# Patient Record
Sex: Male | Born: 1935 | Race: White | Hispanic: No | Marital: Married | Smoking: Former smoker
Health system: Southern US, Community
[De-identification: ages and names within clinical notes are randomized; demographics above are authoritative.]

## PROBLEM LIST (undated history)

## (undated) DIAGNOSIS — C61 Malignant neoplasm of prostate: Secondary | ICD-10-CM

## (undated) DIAGNOSIS — N189 Chronic kidney disease, unspecified: Secondary | ICD-10-CM

## (undated) DIAGNOSIS — J189 Pneumonia, unspecified organism: Secondary | ICD-10-CM

## (undated) DIAGNOSIS — N400 Enlarged prostate without lower urinary tract symptoms: Secondary | ICD-10-CM

## (undated) DIAGNOSIS — I10 Essential (primary) hypertension: Secondary | ICD-10-CM

## (undated) HISTORY — DX: Malignant neoplasm of prostate: C61

## (undated) HISTORY — DX: Benign prostatic hyperplasia without lower urinary tract symptoms: N40.0

## (undated) HISTORY — DX: Essential (primary) hypertension: I10

## (undated) HISTORY — DX: Chronic kidney disease, unspecified: N18.9

---

## 2011-09-11 DIAGNOSIS — C61 Malignant neoplasm of prostate: Secondary | ICD-10-CM

## 2011-09-11 HISTORY — DX: Malignant neoplasm of prostate: C61

## 2011-09-11 HISTORY — PX: OTHER SURGICAL HISTORY: SHX169

## 2011-09-27 ENCOUNTER — Other Ambulatory Visit: Payer: Self-pay | Admitting: Urology

## 2011-09-27 DIAGNOSIS — C61 Malignant neoplasm of prostate: Secondary | ICD-10-CM

## 2011-09-30 ENCOUNTER — Encounter (HOSPITAL_COMMUNITY)
Admission: RE | Admit: 2011-09-30 | Discharge: 2011-09-30 | Disposition: A | Discharge status: Home / Self Care | Payer: Medicare Other | Source: Ambulatory Visit | Attending: Urology | Admitting: Urology

## 2011-09-30 DIAGNOSIS — C61 Malignant neoplasm of prostate: Secondary | ICD-10-CM

## 2011-09-30 MED ORDER — TECHNETIUM TC 99M MEDRONATE IV KIT
26.0000 | PACK | Freq: Once | INTRAVENOUS | Status: AC | PRN
  Administered 2011-09-30: 26 via INTRAVENOUS

## 2011-10-09 ENCOUNTER — Ambulatory Visit: Payer: No Typology Code available for payment source

## 2011-10-09 ENCOUNTER — Encounter: Payer: Self-pay | Admitting: *Deleted

## 2011-10-09 ENCOUNTER — Encounter: Payer: Self-pay | Admitting: Radiation Oncology

## 2011-10-09 ENCOUNTER — Ambulatory Visit: Payer: No Typology Code available for payment source | Admitting: Radiation Oncology

## 2011-10-09 ENCOUNTER — Ambulatory Visit
Admission: RE | Admit: 2011-10-09 | Discharge: 2011-10-09 | Disposition: A | Discharge status: Home / Self Care | Payer: Medicare Other | Source: Ambulatory Visit | Attending: Radiation Oncology | Admitting: Radiation Oncology

## 2011-10-09 VITALS — BP 157/78 | HR 76 | Temp 97.1°F | Resp 20 | Wt 210.9 lb

## 2011-10-09 DIAGNOSIS — C61 Malignant neoplasm of prostate: Secondary | ICD-10-CM | POA: Insufficient documentation

## 2011-10-09 DIAGNOSIS — I10 Essential (primary) hypertension: Secondary | ICD-10-CM | POA: Insufficient documentation

## 2011-10-09 DIAGNOSIS — N189 Chronic kidney disease, unspecified: Secondary | ICD-10-CM | POA: Insufficient documentation

## 2011-10-09 NOTE — Progress Notes (Signed)
Path:09/11/2011: Prostate UJW:[id number],YQMVHQI= 4+4=8, PSA=4.75,Volume=41cc  Married,Retired, 2 children, 1 brother had prostate cancer but deceased from stomach cancer,  No c/o dysuria, no problems stated pt    Allergies:NKDA

## 2011-10-09 NOTE — Progress Notes (Signed)
CC:   Excell Seltzer. Annabell Howells, M.D. Shanon Payor, MD Shelle Iron, MD  REFERRING PHYSICIAN:  Excell Seltzer. Annabell Howells, M.D.  DIAGNOSIS:  Stage T1c Gleason 8 prostate cancer.  NARRATIVE:  Mr. Krupinski is a very pleasant, 78 year old gentleman who is seen out of the courtesy of Dr. Annabell Howells for consideration for radiation therapy as part of the management of the patient's recently diagnosed prostate cancer.  Mr. Celani was found to have a mildly elevated PSA. However, is percent-free PSA was abnormal.  He subsequently proceeded to undergo biopsy with Gleason 8 prostate cancer recovered in 2/12 biopsies.  The patient does live in the [place] area and would like to pursue treatment at the [facility].  In light of this, the patient will be set up to see Dr. Clovis Riley in the near future for a consultation.  Again, the patient's Gleason score is 8 and will likely proceed with androgen ablation as part of his overall therapy.  I will defer to Dr. Clovis Riley as to whether the patient will have fiducial markers placed as part of the patient's image-guided IMRT.    ______________________________ Billie Lade, Ph.D., M.D. JDK/MEDQ  D:  10/09/2011  T:  10/09/2011  Job:  [id number]

## 2011-10-09 NOTE — Progress Notes (Signed)
Please see the Nurse Progress Note in the MD Initial Consult Encounter for this patient. 

## 2015-08-03 NOTE — H&P (Signed)
TOTAL HIP ADMISSION H&P  Patient is admitted for right total hip arthroplasty, anterior approach.  Subjective:  Chief Complaint:   Right hip primary OA / pain  HPI: Troy Mcdowell, 81 y.o. male, has a history of pain and functional disability in the right hip(s) due to arthritis and patient has failed non-surgical conservative treatments for greater than 12 weeks to include NSAID's and/or analgesics and activity modification.  Onset of symptoms was gradual starting 2.5+ years ago with gradually worsening course since that time.The patient noted no past surgery on the right hip(s).  Patient currently rates pain in the right hip at 1 out of 10 with activity, more complaints significant decreased ROM. Patient has worsening of pain with activity and weight bearing, trendelenberg gait, pain that interfers with activities of daily living and pain with passive range of motion. Patient has evidence of periarticular osteophytes and joint space narrowing by imaging studies. This condition presents safety issues increasing the risk of falls.  There is no current active infection.  Risks, benefits and expectations were discussed with the patient.  Risks including but not limited to the risk of anesthesia, blood clots, nerve damage, blood vessel damage, failure of the prosthesis, infection and up to and including death.  Patient understand the risks, benefits and expectations and wishes to proceed with surgery.   PCP: No primary care provider on file.  D/C Plans:      Home with HHPT  Post-op Meds:       No Rx given   Tranexamic Acid:      To be given - IV   Decadron:      Is to be given  FYI:     ASA  Norco    Patient Active Problem List   Diagnosis Date Noted  . Chronic kidney disease   . Hypertension   . Prostate cancer ([place]) 09/11/2011   Past Medical History  Diagnosis Date  . Prostate cancer 09/11/11    prosate ca,gleason $=4=8,PSa=4.75,volume=41cc  . Chronic kidney disease     nephrolithiasis   . Hypertension   . BPH (benign prostatic hyperplasia)   . Cataract     forming  no surgery as yet,obs    Past Surgical History  Procedure Laterality Date  . Prostate biopsy 09/11/2011  09/11/2011    gleason 4+4=8 PSA 4.75    No prescriptions prior to admission   No Known Allergies   Social History  Substance Use Topics  . Smoking status: Former Smoker -- 1.00 packs/day for 15 years    Quit date: 10/09/1979  . Smokeless tobacco: Not on file  . Alcohol Use: Yes     Comment: social    Family History  Problem Relation Age of Onset  . Pneumonia Mother     deceased age 87 with pneumonia  . Heart disease Father     deceased age 9  . Prostate cancer Brother   . Stomach cancer Brother     had prostate ca but deceased from stomach ca     Review of Systems  Constitutional: Negative.   HENT: Positive for hearing loss.   Eyes: Negative.   Respiratory: Negative.   Cardiovascular: Negative.   Gastrointestinal: Negative.   Genitourinary: Negative.   Musculoskeletal: Positive for joint pain.  Skin: Negative.   Neurological: Negative.   Endo/Heme/Allergies: Negative.   Psychiatric/Behavioral: Negative.     Objective:  Physical Exam  Constitutional: He is oriented to person, place, and time. He appears well-developed.  HENT:  Head:  Normocephalic.  Eyes: Pupils are equal, round, and reactive to light.  Neck: Neck supple. No JVD present. No tracheal deviation present. No thyromegaly present.  Cardiovascular: Normal rate, regular rhythm, normal heart sounds and intact distal pulses.   Respiratory: Effort normal and breath sounds normal. No stridor. No respiratory distress. He has no wheezes.  GI: Soft. There is no tenderness. There is no guarding.  Musculoskeletal:       Right hip: He exhibits decreased range of motion, decreased strength, tenderness and bony tenderness. He exhibits no swelling, no deformity and no laceration.  Lymphadenopathy:    He has no cervical adenopathy.   Neurological: He is alert and oriented to person, place, and time.  Skin: Skin is warm and dry.  Psychiatric: He has a normal mood and affect.      Imaging Review Plain radiographs demonstrate severe degenerative joint disease of the right hip(s). The bone quality appears to be good for age and reported activity level.  Assessment/Plan:  End stage arthritis, right hip(s)  The patient history, physical examination, clinical judgement of the provider and imaging studies are consistent with end stage degenerative joint disease of the right hip(s) and total hip arthroplasty is deemed medically necessary. The treatment options including medical management, injection therapy, arthroscopy and arthroplasty were discussed at length. The risks and benefits of total hip arthroplasty were presented and reviewed. The risks due to aseptic loosening, infection, stiffness, dislocation/subluxation,  thromboembolic complications and other imponderables were discussed.  The patient acknowledged the explanation, agreed to proceed with the plan and consent was signed. Patient is being admitted for inpatient treatment for surgery, pain control, PT, OT, prophylactic antibiotics, VTE prophylaxis, progressive ambulation and ADL's and discharge planning.The patient is planning to be discharged home with home health services.      [place] Signa Cheek   PA-C  08/03/2015, 10:17 AM

## 2015-08-07 ENCOUNTER — Other Ambulatory Visit (HOSPITAL_COMMUNITY): Payer: Self-pay | Admitting: *Deleted

## 2015-08-07 NOTE — Patient Instructions (Addendum)
Troy Mcdowell  08/07/2015   Your procedure is scheduled on: 08-15-15  Report to [facility]  Entrance take [facility]  elevators to 3rd floor to  [place] at 8:30 AM.  Call this number if you have problems the morning of surgery [phone]   Remember: ONLY 1 PERSON MAY GO WITH YOU TO SHORT STAY TO GET  READY MORNING OF Troy Mcdowell.  Do not eat food or drink liquids :After Midnight.     Take these medicines the morning of surgery with A SIP OF WATER: Amlodipine (Norvasc) DO NOT TAKE ANY DIABETIC MEDICATIONS DAY OF YOUR SURGERY                               You may not have any metal on your body including hair pins and              piercings  Do not wear jewelry, , lotions, powders or perfumes, deodorant                      Men may shave face and neck.   Do not bring valuables to the hospital. Troy Mcdowell.  Contacts, dentures or bridgework may not be worn into surgery.  Leave suitcase in the car. After surgery it may be brought to your room.        Special Instructions: coughing and deep breathing exercises, leg exercises              Please read over the following fact sheets you were given: _____________________________________________________________________             [facility] - Preparing for Surgery Before surgery, you can play an important role.  Because skin is not sterile, your skin needs to be as free of germs as possible.  You can reduce the number of germs on your skin by washing with CHG (chlorahexidine gluconate) soap before surgery.  CHG is an antiseptic cleaner which kills germs and bonds with the skin to continue killing germs even after washing. Please DO NOT use if you have an allergy to CHG or antibacterial soaps.  If your skin becomes reddened/irritated stop using the CHG and inform your nurse when you arrive at Short Stay. Do not shave (including legs and underarms) for at  least 48 hours prior to the first CHG shower.  You may shave your face/neck. Please follow these instructions carefully:  1.  Shower with CHG Soap the night before surgery and the  morning of Surgery.  2.  If you choose to wash your hair, wash your hair first as usual with your  normal  shampoo.  3.  After you shampoo, rinse your hair and body thoroughly to remove the  shampoo.                           4.  Use CHG as you would any other liquid soap.  You can apply chg directly  to the skin and wash                       Gently with a scrungie or clean washcloth.  5.  Apply the  CHG Soap to your body ONLY FROM THE NECK DOWN.   Do not use on face/ open                           Wound or open sores. Avoid contact with eyes, ears mouth and genitals (private parts).                       Wash face,  Genitals (private parts) with your normal soap.             6.  Wash thoroughly, paying special attention to the area where your surgery  will be performed.  7.  Thoroughly rinse your body with warm water from the neck down.  8.  DO NOT shower/wash with your normal soap after using and rinsing off  the CHG Soap.                9.  Pat yourself dry with a clean towel.            10.  Wear clean pajamas.            11.  Place clean sheets on your bed the night of your first shower and do not  sleep with pets. Day of Surgery : Do not apply any lotions/deodorants the morning of surgery.  Please wear clean clothes to the hospital/surgery center.  FAILURE TO FOLLOW THESE INSTRUCTIONS MAY RESULT IN THE CANCELLATION OF YOUR SURGERY PATIENT SIGNATURE_________________________________  NURSE SIGNATURE__________________________________  ________________________________________________________________________   Troy Mcdowell  An incentive spirometer is a tool that can help keep your lungs clear and active. This tool measures how well you are filling your lungs with each breath. Taking long deep breaths  may help reverse or decrease the chance of developing breathing (pulmonary) problems (especially infection) following:  A long period of time when you are unable to move or be active. BEFORE THE PROCEDURE   If the spirometer includes an indicator to show your best effort, your nurse or respiratory therapist will set it to a desired goal.  If possible, sit up straight or lean slightly forward. Try not to slouch.  Hold the incentive spirometer in an upright position. INSTRUCTIONS FOR USE   Sit on the edge of your bed if possible, or sit up as far as you can in bed or on a chair.  Hold the incentive spirometer in an upright position.  Breathe out normally.  Place the mouthpiece in your mouth and seal your lips tightly around it.  Breathe in slowly and as deeply as possible, raising the piston or the ball toward the top of the column.  Hold your breath for 3-5 seconds or for as long as possible. Allow the piston or ball to fall to the bottom of the column.  Remove the mouthpiece from your mouth and breathe out normally.  Rest for a few seconds and repeat Steps 1 through 7 at least 10 times every 1-2 hours when you are awake. Take your time and take a few normal breaths between deep breaths.  The spirometer may include an indicator to show your best effort. Use the indicator as a goal to work toward during each repetition.  After each set of 10 deep breaths, practice coughing to be sure your lungs are clear. If you have an incision (the cut made at the time of surgery), support your incision when coughing by placing a pillow or rolled up  towels firmly against it. Once you are able to get out of bed, walk around indoors and cough well. You may stop using the incentive spirometer when instructed by your caregiver.  RISKS AND COMPLICATIONS  Take your time so you do not get dizzy or light-headed.  If you are in pain, you may need to take or ask for pain medication before doing incentive  spirometry. It is harder to take a deep breath if you are having pain. AFTER USE  Rest and breathe slowly and easily.  It can be helpful to keep track of a log of your progress. Your caregiver can provide you with a simple table to help with this. If you are using the spirometer at home, follow these instructions: [place] IF:   You are having difficultly using the spirometer.  You have trouble using the spirometer as often as instructed.  Your pain medication is not giving enough relief while using the spirometer.  You develop fever of 100.5 F (38.1 C) or higher. SEEK IMMEDIATE MEDICAL CARE IF:   You cough up bloody sputum that had not been present before.  You develop fever of 102 F (38.9 C) or greater.  You develop worsening pain at or near the incision site. MAKE SURE YOU:   Understand these instructions.  Will watch your condition.  Will get help right away if you are not doing well or get worse. Document Released: 12/30/2006 Document Revised: 11/11/2011 Document Reviewed: 03/02/2007 ExitCare Patient Information 2014 ExitCare, [place].   ________________________________________________________________________  WHAT IS A BLOOD TRANSFUSION? Blood Transfusion Information  A transfusion is the replacement of blood or some of its parts. Blood is made up of multiple cells which provide different functions.  Red blood cells carry oxygen and are used for blood loss replacement.  White blood cells fight against infection.  Platelets control bleeding.  Plasma helps clot blood.  Other blood products are available for specialized needs, such as hemophilia or other clotting disorders. BEFORE THE TRANSFUSION  Who gives blood for transfusions?   Healthy volunteers who are fully evaluated to make sure their blood is safe. This is blood bank blood. Transfusion therapy is the safest it has ever been in the practice of medicine. Before blood is taken from a donor, a  complete history is taken to make sure that person has no history of diseases nor engages in risky social behavior (examples are intravenous drug use or sexual activity with multiple partners). The donor's travel history is screened to minimize risk of transmitting infections, such as malaria. The donated blood is tested for signs of infectious diseases, such as HIV and hepatitis. The blood is then tested to be sure it is compatible with you in order to minimize the chance of a transfusion reaction. If you or a relative donates blood, this is often done in anticipation of surgery and is not appropriate for emergency situations. It takes many days to process the donated blood. RISKS AND COMPLICATIONS Although transfusion therapy is very safe and saves many lives, the main dangers of transfusion include:   Getting an infectious disease.  Developing a transfusion reaction. This is an allergic reaction to something in the blood you were given. Every precaution is taken to prevent this. The decision to have a blood transfusion has been considered carefully by your caregiver before blood is given. Blood is not given unless the benefits outweigh the risks. AFTER THE TRANSFUSION  Right after receiving a blood transfusion, you will usually feel much  better and more energetic. This is especially true if your red blood cells have gotten low (anemic). The transfusion raises the level of the red blood cells which carry oxygen, and this usually causes an energy increase.  The nurse administering the transfusion will monitor you carefully for complications. HOME CARE INSTRUCTIONS  No special instructions are needed after a transfusion. You may find your energy is better. Speak with your caregiver about any limitations on activity for underlying diseases you may have. SEEK MEDICAL CARE IF:   Your condition is not improving after your transfusion.  You develop redness or irritation at the intravenous (IV)  site. SEEK IMMEDIATE MEDICAL CARE IF:  Any of the following symptoms occur over the next 12 hours:  Shaking chills.  You have a temperature by mouth above 102 F (38.9 C), not controlled by medicine.  Chest, back, or muscle pain.  People around you feel you are not acting correctly or are confused.  Shortness of breath or difficulty breathing.  Dizziness and fainting.  You get a rash or develop hives.  You have a decrease in urine output.  Your urine turns a dark color or changes to pink, red, or brown. Any of the following symptoms occur over the next 10 days:  You have a temperature by mouth above 102 F (38.9 C), not controlled by medicine.  Shortness of breath.  Weakness after normal activity.  The white part of the eye turns yellow (jaundice).  You have a decrease in the amount of urine or are urinating less often.  Your urine turns a dark color or changes to pink, red, or brown. Document Released: 08/16/2000 Document Revised: 11/11/2011 Document Reviewed: 04/04/2008 [facility] Patient Information 2014 [place], [place].  _______________________________________________________________________

## 2015-08-08 ENCOUNTER — Encounter (HOSPITAL_COMMUNITY)
Admission: RE | Admit: 2015-08-08 | Discharge: 2015-08-08 | Disposition: A | Discharge status: Home / Self Care | Payer: Medicare Other | Source: Ambulatory Visit | Attending: Orthopedic Surgery | Admitting: Orthopedic Surgery

## 2015-08-08 ENCOUNTER — Encounter (HOSPITAL_COMMUNITY): Payer: Self-pay

## 2015-08-08 DIAGNOSIS — Z01818 Encounter for other preprocedural examination: Secondary | ICD-10-CM | POA: Insufficient documentation

## 2015-08-08 DIAGNOSIS — M1611 Unilateral primary osteoarthritis, right hip: Secondary | ICD-10-CM | POA: Insufficient documentation

## 2015-08-08 HISTORY — DX: Pneumonia, unspecified organism: J18.9

## 2015-08-08 LAB — ABO/RH: ABO/RH(D): A POS

## 2015-08-08 LAB — URINALYSIS, ROUTINE W REFLEX MICROSCOPIC
Bilirubin Urine: NEGATIVE
GLUCOSE, UA: NEGATIVE mg/dL
Hgb urine dipstick: NEGATIVE
KETONES UR: NEGATIVE mg/dL
LEUKOCYTES UA: NEGATIVE
Nitrite: NEGATIVE
PH: 6.5 (ref 5.0–8.0)
Protein, ur: NEGATIVE mg/dL
Specific Gravity, Urine: 1.013 (ref 1.005–1.030)

## 2015-08-08 LAB — BASIC METABOLIC PANEL
ANION GAP: 8 (ref 5–15)
BUN: 16 mg/dL (ref 6–20)
CALCIUM: 9.7 mg/dL (ref 8.9–10.3)
CO2: 23 mmol/L (ref 22–32)
Chloride: 106 mmol/L (ref 101–111)
Creatinine, Ser: 0.83 mg/dL (ref 0.61–1.24)
GLUCOSE: 87 mg/dL (ref 65–99)
Potassium: 4.2 mmol/L (ref 3.5–5.1)
SODIUM: 137 mmol/L (ref 135–145)

## 2015-08-08 LAB — CBC
HCT: 47.9 % (ref 39.0–52.0)
HEMOGLOBIN: 16.5 g/dL (ref 13.0–17.0)
MCH: 28.8 pg (ref 26.0–34.0)
MCHC: 34.4 g/dL (ref 30.0–36.0)
MCV: 83.7 fL (ref 78.0–100.0)
Platelets: 183 10*3/uL (ref 150–400)
RBC: 5.72 MIL/uL (ref 4.22–5.81)
RDW: 13.3 % (ref 11.5–15.5)
WBC: 7.5 10*3/uL (ref 4.0–10.5)

## 2015-08-08 LAB — SURGICAL PCR SCREEN
MRSA, PCR: NEGATIVE
STAPHYLOCOCCUS AUREUS: NEGATIVE

## 2015-08-08 LAB — PROTIME-INR
INR: 1.02 (ref 0.00–1.49)
PROTHROMBIN TIME: 13.6 s (ref 11.6–15.2)

## 2015-08-08 LAB — APTT: APTT: 31 s (ref 24–37)

## 2015-08-08 NOTE — Progress Notes (Signed)
08-08-15 - Left message for [name] at [facility] regarding Mr. Troy Mcdowell - PAT visit on 08-08-15 - preliminary EKG results were Normal sinus rhythm - Normal EKG.

## 2015-08-08 NOTE — Progress Notes (Signed)
08-08-15 - Faxed preop lab results and EKG to [name] at [place] at [phone] as requested.

## 2015-08-15 ENCOUNTER — Inpatient Hospital Stay (HOSPITAL_COMMUNITY): Payer: Medicare Other | Admitting: Anesthesiology

## 2015-08-15 ENCOUNTER — Encounter (HOSPITAL_COMMUNITY)
Admission: RE | Disposition: A | Discharge status: Home Health | Payer: Self-pay | Source: Ambulatory Visit | Attending: Orthopedic Surgery

## 2015-08-15 ENCOUNTER — Encounter (HOSPITAL_COMMUNITY): Payer: Self-pay | Admitting: *Deleted

## 2015-08-15 ENCOUNTER — Inpatient Hospital Stay (HOSPITAL_COMMUNITY): Payer: Medicare Other

## 2015-08-15 ENCOUNTER — Inpatient Hospital Stay (HOSPITAL_COMMUNITY)
Admission: RE | Admit: 2015-08-15 | Discharge: 2015-08-16 | DRG: 470 | Disposition: A | Discharge status: Home Health | Payer: Medicare Other | Source: Ambulatory Visit | Attending: Orthopedic Surgery | Admitting: Orthopedic Surgery

## 2015-08-15 DIAGNOSIS — Z8546 Personal history of malignant neoplasm of prostate: Secondary | ICD-10-CM | POA: Diagnosis not present

## 2015-08-15 DIAGNOSIS — Z87891 Personal history of nicotine dependence: Secondary | ICD-10-CM

## 2015-08-15 DIAGNOSIS — M25551 Pain in right hip: Secondary | ICD-10-CM | POA: Diagnosis present

## 2015-08-15 DIAGNOSIS — I129 Hypertensive chronic kidney disease with stage 1 through stage 4 chronic kidney disease, or unspecified chronic kidney disease: Secondary | ICD-10-CM | POA: Diagnosis not present

## 2015-08-15 DIAGNOSIS — N189 Chronic kidney disease, unspecified: Secondary | ICD-10-CM | POA: Diagnosis present

## 2015-08-15 DIAGNOSIS — M1611 Unilateral primary osteoarthritis, right hip: Principal | ICD-10-CM | POA: Diagnosis present

## 2015-08-15 DIAGNOSIS — Z01812 Encounter for preprocedural laboratory examination: Secondary | ICD-10-CM | POA: Diagnosis not present

## 2015-08-15 DIAGNOSIS — E669 Obesity, unspecified: Secondary | ICD-10-CM | POA: Diagnosis present

## 2015-08-15 DIAGNOSIS — Z96649 Presence of unspecified artificial hip joint: Secondary | ICD-10-CM

## 2015-08-15 HISTORY — PX: TOTAL HIP ARTHROPLASTY: SHX124

## 2015-08-15 LAB — TYPE AND SCREEN
ABO/RH(D): A POS
Antibody Screen: NEGATIVE

## 2015-08-15 SURGERY — ARTHROPLASTY, HIP, TOTAL, ANTERIOR APPROACH
Anesthesia: Spinal | Site: Hip | Laterality: Right

## 2015-08-15 MED ORDER — SODIUM CHLORIDE 0.9 % IV SOLN
1000.0000 mg | Freq: Once | INTRAVENOUS | Status: AC
  Administered 2015-08-15: 1000 mg via INTRAVENOUS
  Filled 2015-08-15: qty 10

## 2015-08-15 MED ORDER — METOCLOPRAMIDE HCL 10 MG PO TABS
5.0000 mg | ORAL_TABLET | Freq: Three times a day (TID) | ORAL | Status: DC | PRN
  Administered 2015-08-16: 10 mg via ORAL
  Filled 2015-08-15: qty 1

## 2015-08-15 MED ORDER — DEXAMETHASONE SODIUM PHOSPHATE 10 MG/ML IJ SOLN
INTRAMUSCULAR | Status: DC | PRN
  Administered 2015-08-15: 10 mg via INTRAVENOUS

## 2015-08-15 MED ORDER — PROPOFOL 10 MG/ML IV BOLUS
INTRAVENOUS | Status: AC
  Filled 2015-08-15: qty 20

## 2015-08-15 MED ORDER — AMLODIPINE BESYLATE 10 MG PO TABS
10.0000 mg | ORAL_TABLET | Freq: Every day | ORAL | Status: DC
  Administered 2015-08-15 – 2015-08-16 (×2): 10 mg via ORAL
  Filled 2015-08-15 (×2): qty 1

## 2015-08-15 MED ORDER — DEXAMETHASONE SODIUM PHOSPHATE 10 MG/ML IJ SOLN
INTRAMUSCULAR | Status: AC
  Filled 2015-08-15: qty 1

## 2015-08-15 MED ORDER — CELECOXIB 200 MG PO CAPS
200.0000 mg | ORAL_CAPSULE | Freq: Two times a day (BID) | ORAL | Status: DC
  Administered 2015-08-15 – 2015-08-16 (×2): 200 mg via ORAL
  Filled 2015-08-15 (×3): qty 1

## 2015-08-15 MED ORDER — LACTATED RINGERS IV SOLN
INTRAVENOUS | Status: DC
  Administered 2015-08-15: 1000 mL via INTRAVENOUS
  Administered 2015-08-15: 13:00:00 via INTRAVENOUS

## 2015-08-15 MED ORDER — HYDROMORPHONE HCL 1 MG/ML IJ SOLN
0.2500 mg | INTRAMUSCULAR | Status: DC | PRN
  Administered 2015-08-15 (×3): 0.5 mg via INTRAVENOUS

## 2015-08-15 MED ORDER — POLYETHYLENE GLYCOL 3350 17 G PO PACK
17.0000 g | PACK | Freq: Two times a day (BID) | ORAL | Status: DC
  Administered 2015-08-15 – 2015-08-16 (×3): 17 g via ORAL

## 2015-08-15 MED ORDER — PROPOFOL 10 MG/ML IV BOLUS
INTRAVENOUS | Status: DC | PRN
  Administered 2015-08-15: 10 mg via INTRAVENOUS
  Administered 2015-08-15: 20 mg via INTRAVENOUS
  Administered 2015-08-15: 10 mg via INTRAVENOUS
  Administered 2015-08-15: 20 mg via INTRAVENOUS
  Administered 2015-08-15: 50 mg via INTRAVENOUS
  Administered 2015-08-15: 20 mg via INTRAVENOUS
  Administered 2015-08-15: 40 mg via INTRAVENOUS
  Administered 2015-08-15: 20 mg via INTRAVENOUS
  Administered 2015-08-15: 30 mg via INTRAVENOUS
  Administered 2015-08-15: 10 mg via INTRAVENOUS
  Administered 2015-08-15: 20 mg via INTRAVENOUS
  Administered 2015-08-15: 30 mg via INTRAVENOUS
  Administered 2015-08-15: 20 mg via INTRAVENOUS
  Administered 2015-08-15 (×3): 30 mg via INTRAVENOUS
  Administered 2015-08-15 (×4): 20 mg via INTRAVENOUS
  Administered 2015-08-15: 10 mg via INTRAVENOUS
  Administered 2015-08-15: 40 mg via INTRAVENOUS
  Administered 2015-08-15: 20 mg via INTRAVENOUS
  Administered 2015-08-15: 10 mg via INTRAVENOUS
  Administered 2015-08-15: 20 mg via INTRAVENOUS
  Administered 2015-08-15: 30 mg via INTRAVENOUS
  Administered 2015-08-15: 10 mg via INTRAVENOUS
  Administered 2015-08-15 (×5): 20 mg via INTRAVENOUS
  Administered 2015-08-15: 30 mg via INTRAVENOUS
  Administered 2015-08-15: 10 mg via INTRAVENOUS
  Administered 2015-08-15 (×2): 20 mg via INTRAVENOUS
  Administered 2015-08-15: 10 mg via INTRAVENOUS

## 2015-08-15 MED ORDER — DIPHENHYDRAMINE HCL 25 MG PO CAPS: 25.0000 mg | ORAL_CAPSULE | Freq: Four times a day (QID) | ORAL | Status: DC | PRN

## 2015-08-15 MED ORDER — PHENYLEPHRINE HCL 10 MG/ML IJ SOLN
10.0000 mg | INTRAVENOUS | Status: DC | PRN
  Administered 2015-08-15: 50 ug/min via INTRAVENOUS

## 2015-08-15 MED ORDER — HYDROMORPHONE HCL 1 MG/ML IJ SOLN
INTRAMUSCULAR | Status: AC
  Filled 2015-08-15: qty 1

## 2015-08-15 MED ORDER — MAGNESIUM CITRATE PO SOLN: 1.0000 | Freq: Once | ORAL | Status: DC | PRN

## 2015-08-15 MED ORDER — ASPIRIN EC 325 MG PO TBEC
325.0000 mg | DELAYED_RELEASE_TABLET | Freq: Two times a day (BID) | ORAL | Status: DC
  Administered 2015-08-16: 325 mg via ORAL
  Filled 2015-08-15 (×3): qty 1

## 2015-08-15 MED ORDER — METHOCARBAMOL 500 MG PO TABS: 500.0000 mg | ORAL_TABLET | Freq: Four times a day (QID) | ORAL | Status: DC | PRN

## 2015-08-15 MED ORDER — PHENYLEPHRINE HCL 10 MG/ML IJ SOLN
INTRAMUSCULAR | Status: AC
  Filled 2015-08-15: qty 1

## 2015-08-15 MED ORDER — MENTHOL 3 MG MT LOZG: 1.0000 | LOZENGE | OROMUCOSAL | Status: DC | PRN

## 2015-08-15 MED ORDER — PHENOL 1.4 % MT LIQD: 1.0000 | OROMUCOSAL | Status: DC | PRN

## 2015-08-15 MED ORDER — PHENYLEPHRINE HCL 10 MG/ML IJ SOLN
INTRAMUSCULAR | Status: DC | PRN
  Administered 2015-08-15 (×2): 40 ug via INTRAVENOUS
  Administered 2015-08-15: 120 ug via INTRAVENOUS
  Administered 2015-08-15: 40 ug via INTRAVENOUS

## 2015-08-15 MED ORDER — DOCUSATE SODIUM 100 MG PO CAPS
100.0000 mg | ORAL_CAPSULE | Freq: Two times a day (BID) | ORAL | Status: DC
  Administered 2015-08-15 – 2015-08-16 (×3): 100 mg via ORAL

## 2015-08-15 MED ORDER — LIDOCAINE HCL (CARDIAC) 20 MG/ML IV SOLN
INTRAVENOUS | Status: DC | PRN
  Administered 2015-08-15: 50 mg via INTRAVENOUS

## 2015-08-15 MED ORDER — LIDOCAINE HCL (CARDIAC) 20 MG/ML IV SOLN
INTRAVENOUS | Status: AC
  Filled 2015-08-15: qty 5

## 2015-08-15 MED ORDER — METHOCARBAMOL 1000 MG/10ML IJ SOLN
500.0000 mg | Freq: Four times a day (QID) | INTRAVENOUS | Status: DC | PRN
  Administered 2015-08-15: 500 mg via INTRAVENOUS
  Filled 2015-08-15 (×2): qty 5

## 2015-08-15 MED ORDER — DEXAMETHASONE SODIUM PHOSPHATE 10 MG/ML IJ SOLN: 10.0000 mg | Freq: Once | INTRAMUSCULAR | Status: DC

## 2015-08-15 MED ORDER — BUPIVACAINE IN DEXTROSE 0.75-8.25 % IT SOLN
INTRATHECAL | Status: DC | PRN
  Administered 2015-08-15: 2 mL via INTRATHECAL

## 2015-08-15 MED ORDER — CEFAZOLIN SODIUM-DEXTROSE 2-3 GM-% IV SOLR
2.0000 g | Freq: Four times a day (QID) | INTRAVENOUS | Status: AC
  Administered 2015-08-15 – 2015-08-16 (×2): 2 g via INTRAVENOUS
  Filled 2015-08-15 (×2): qty 50

## 2015-08-15 MED ORDER — PHENYLEPHRINE 40 MCG/ML (10ML) SYRINGE FOR IV PUSH (FOR BLOOD PRESSURE SUPPORT)
PREFILLED_SYRINGE | INTRAVENOUS | Status: AC
  Filled 2015-08-15: qty 10

## 2015-08-15 MED ORDER — CEFAZOLIN SODIUM-DEXTROSE 2-3 GM-% IV SOLR
INTRAVENOUS | Status: AC
  Filled 2015-08-15: qty 50

## 2015-08-15 MED ORDER — HYDROMORPHONE HCL 1 MG/ML IJ SOLN
0.5000 mg | INTRAMUSCULAR | Status: DC | PRN
  Administered 2015-08-15: 1 mg via INTRAVENOUS
  Administered 2015-08-15: 0.5 mg via INTRAVENOUS
  Filled 2015-08-15 (×2): qty 1

## 2015-08-15 MED ORDER — CEFAZOLIN SODIUM-DEXTROSE 2-3 GM-% IV SOLR
2.0000 g | INTRAVENOUS | Status: AC
  Administered 2015-08-15: 2 g via INTRAVENOUS

## 2015-08-15 MED ORDER — DEXAMETHASONE SODIUM PHOSPHATE 10 MG/ML IJ SOLN
10.0000 mg | Freq: Once | INTRAMUSCULAR | Status: AC
  Administered 2015-08-16: 10 mg via INTRAVENOUS
  Filled 2015-08-15: qty 1

## 2015-08-15 MED ORDER — HYDROCODONE-ACETAMINOPHEN 7.5-325 MG PO TABS
1.0000 | ORAL_TABLET | ORAL | Status: DC
  Administered 2015-08-15 (×2): 2 via ORAL
  Administered 2015-08-16 (×3): 1 via ORAL
  Filled 2015-08-15 (×2): qty 1
  Filled 2015-08-15: qty 2
  Filled 2015-08-15: qty 1
  Filled 2015-08-15: qty 2

## 2015-08-15 MED ORDER — ONDANSETRON HCL 4 MG PO TABS: 4.0000 mg | ORAL_TABLET | Freq: Four times a day (QID) | ORAL | Status: DC | PRN

## 2015-08-15 MED ORDER — CHLORHEXIDINE GLUCONATE 4 % EX LIQD: 60.0000 mL | Freq: Once | CUTANEOUS | Status: DC

## 2015-08-15 MED ORDER — ONDANSETRON HCL 4 MG/2ML IJ SOLN
4.0000 mg | Freq: Four times a day (QID) | INTRAMUSCULAR | Status: DC | PRN
  Administered 2015-08-16: 4 mg via INTRAVENOUS
  Filled 2015-08-15: qty 2

## 2015-08-15 MED ORDER — BISACODYL 10 MG RE SUPP: 10.0000 mg | Freq: Every day | RECTAL | Status: DC | PRN

## 2015-08-15 MED ORDER — PROMETHAZINE HCL 25 MG/ML IJ SOLN: 6.2500 mg | INTRAMUSCULAR | Status: DC | PRN

## 2015-08-15 MED ORDER — PROPOFOL 10 MG/ML IV BOLUS
INTRAVENOUS | Status: AC
  Filled 2015-08-15: qty 40

## 2015-08-15 MED ORDER — METOCLOPRAMIDE HCL 5 MG/ML IJ SOLN: 5.0000 mg | Freq: Three times a day (TID) | INTRAMUSCULAR | Status: DC | PRN

## 2015-08-15 MED ORDER — POTASSIUM CHLORIDE 2 MEQ/ML IV SOLN
100.0000 mL/h | INTRAVENOUS | Status: DC
  Administered 2015-08-15 – 2015-08-16 (×2): 100 mL/h via INTRAVENOUS
  Filled 2015-08-15 (×5): qty 1000

## 2015-08-15 MED ORDER — FERROUS SULFATE 325 (65 FE) MG PO TABS
325.0000 mg | ORAL_TABLET | Freq: Three times a day (TID) | ORAL | Status: DC
  Administered 2015-08-15 – 2015-08-16 (×3): 325 mg via ORAL
  Filled 2015-08-15 (×5): qty 1

## 2015-08-15 MED ORDER — ALUM & MAG HYDROXIDE-SIMETH 200-200-20 MG/5ML PO SUSP: 30.0000 mL | ORAL | Status: DC | PRN

## 2015-08-15 SURGICAL SUPPLY — 32 items
BAG DECANTER FOR FLEXI CONT (MISCELLANEOUS) IMPLANT
BAG ZIPLOCK 12X15 (MISCELLANEOUS) IMPLANT
CAPT HIP TOTAL 2 ×3 IMPLANT
CLOTH BEACON ORANGE TIMEOUT ST (SAFETY) ×3 IMPLANT
COVER PERINEAL POST (MISCELLANEOUS) ×3 IMPLANT
DRAPE STERI IOBAN 125X83 (DRAPES) ×3 IMPLANT
DRAPE U-SHAPE 47X51 STRL (DRAPES) ×6 IMPLANT
DRSG AQUACEL AG ADV 3.5X10 (GAUZE/BANDAGES/DRESSINGS) ×3 IMPLANT
DURAPREP 26ML APPLICATOR (WOUND CARE) ×3 IMPLANT
ELECT REM PT RETURN 15FT ADLT (MISCELLANEOUS) IMPLANT
ELECT REM PT RETURN 9FT ADLT (ELECTROSURGICAL) ×3
ELECTRODE REM PT RTRN 9FT ADLT (ELECTROSURGICAL) ×1 IMPLANT
GLOVE BIOGEL M 7.0 STRL (GLOVE) IMPLANT
GLOVE BIOGEL PI IND STRL 7.5 (GLOVE) ×1 IMPLANT
GLOVE BIOGEL PI IND STRL 8.5 (GLOVE) ×1 IMPLANT
GLOVE BIOGEL PI INDICATOR 7.5 (GLOVE) ×2
GLOVE BIOGEL PI INDICATOR 8.5 (GLOVE) ×2
GLOVE ECLIPSE 8.0 STRL XLNG CF (GLOVE) ×6 IMPLANT
GLOVE ORTHO TXT STRL SZ7.5 (GLOVE) ×3 IMPLANT
GOWN STRL REUS W/TWL LRG LVL3 (GOWN DISPOSABLE) ×3 IMPLANT
GOWN STRL REUS W/TWL XL LVL3 (GOWN DISPOSABLE) ×3 IMPLANT
HOLDER FOLEY CATH W/STRAP (MISCELLANEOUS) ×3 IMPLANT
LIQUID BAND (GAUZE/BANDAGES/DRESSINGS) ×3 IMPLANT
PACK ANTERIOR HIP CUSTOM (KITS) ×3 IMPLANT
SAW OSC TIP CART 19.5X105X1.3 (SAW) ×3 IMPLANT
SUT MNCRL AB 4-0 PS2 18 (SUTURE) ×3 IMPLANT
SUT VIC AB 1 CT1 36 (SUTURE) ×9 IMPLANT
SUT VIC AB 2-0 CT1 27 (SUTURE) ×4
SUT VIC AB 2-0 CT1 TAPERPNT 27 (SUTURE) ×2 IMPLANT
SUT VLOC 180 0 24IN GS25 (SUTURE) ×3 IMPLANT
TRAY FOLEY W/METER SILVER 16FR (SET/KITS/TRAYS/PACK) IMPLANT
WATER STERILE IRR 1500ML POUR (IV SOLUTION) ×3 IMPLANT

## 2015-08-15 NOTE — Anesthesia Postprocedure Evaluation (Signed)
Anesthesia Post Note  Patient: Troy Mcdowell  Procedure(s) Performed: Procedure(s) (LRB): RIGHT TOTAL HIP ARTHROPLASTY ANTERIOR APPROACH (Right)  Patient location during evaluation: PACU Anesthesia Type: Spinal Level of consciousness: oriented and awake and alert Pain management: pain level controlled Vital Signs Assessment: post-procedure vital signs reviewed and stable Respiratory status: spontaneous breathing, respiratory function stable and patient connected to nasal cannula oxygen Cardiovascular status: blood pressure returned to baseline and stable Postop Assessment: no headache, no backache and spinal receding Anesthetic complications: no    Last Vitals:  Filed Vitals:   08/15/15 1716 08/15/15 1815  BP: 116/64 124/61  Pulse: 77 78  Temp: 36.4 C 36.4 C  Resp: 16 16    Last Pain:  Filed Vitals:   08/15/15 1903  PainSc: Asleep                 Tremel Setters J

## 2015-08-15 NOTE — Transfer of Care (Signed)
Immediate Anesthesia Transfer of Care Note  Patient: Troy Mcdowell  Procedure(s) Performed: Procedure(s): RIGHT TOTAL HIP ARTHROPLASTY ANTERIOR APPROACH (Right)  Patient Location: PACU  Anesthesia Type:MAC and Spinal  Level of Consciousness: Patient easily awoken, comfortable, cooperative, following commands, responds to stimulation.   Airway & Oxygen Therapy: Patient spontaneously breathing, ventilating well, oxygen via simple oxygen mask.  Post-op Assessment: Report given to PACU RN, vital signs reviewed and stable, moving all extremities.   Post vital signs: Reviewed and stable.  Complications: No apparent anesthesia complications

## 2015-08-15 NOTE — Anesthesia Procedure Notes (Signed)
Spinal Patient location during procedure: OR Staffing Anesthesiologist: Franne Grip Performed by: anesthesiologist  Preanesthetic Checklist Completed: patient identified, site marked, surgical consent, pre-op evaluation, timeout performed, IV checked, risks and benefits discussed and monitors and equipment checked Spinal Block Patient position: sitting Prep: Betadine Patient monitoring: heart rate, continuous pulse ox and blood pressure Approach: midline Location: L3-4 Injection technique: single-shot Needle Needle type: Spinocan  Needle gauge: 22 G Needle length: 9 cm Additional Notes Expiration date of kit checked and confirmed. Patient tolerated procedure well, without complications. No paresthesia. CSF clear. Meaningful verbal contact maintained throughout procedure.

## 2015-08-15 NOTE — Progress Notes (Signed)
Utilization review completed.  

## 2015-08-15 NOTE — Interval H&P Note (Signed)
History and Physical Interval Note:  08/15/2015 10:44 AM  Mitzie Na  has presented today for surgery, with the diagnosis of right hip osteoarthritis  The various methods of treatment have been discussed with the patient and family. After consideration of risks, benefits and other options for treatment, the patient has consented to  Procedure(s): RIGHT TOTAL HIP ARTHROPLASTY ANTERIOR APPROACH (Right) as a surgical intervention .  The patient's history has been reviewed, patient examined, no change in status, stable for surgery.  I have reviewed the patient's chart and labs.  Questions were answered to the patient's satisfaction.     Mauri Pole

## 2015-08-15 NOTE — Op Note (Signed)
NAME:  Troy Mcdowell                ACCOUNT NO.: [id number]      MEDICAL RECORD NO.: [id number]      FACILITY:  [facility]      PHYSICIAN:  Paralee Cancel D  DATE OF BIRTH:  1933-11-01     DATE OF PROCEDURE:  08/15/2015                                 OPERATIVE REPORT         PREOPERATIVE DIAGNOSIS: Right  hip osteoarthritis.      POSTOPERATIVE DIAGNOSIS:  Right hip osteoarthritis.      PROCEDURE:  Right total hip replacement through an anterior approach   utilizing DePuy THR system, component size 49mm pinnacle cup, a size 36+4 neutral   Altrex liner, a size 7 Hi Tri Lock stem with a 36+5 delta ceramic   ball.      SURGEON:  Pietro Cassis. Alvan Dame, M.D.      ASSISTANT:  Nehemiah Massed, PA-C     ANESTHESIA:  Spinal.      SPECIMENS:  None.      COMPLICATIONS:  None.      BLOOD LOSS:  300 cc     DRAINS:  None.      INDICATION OF THE PROCEDURE:  Troy Mcdowell is a 81 y.o. male who had   presented to office for evaluation of right hip pain.  Radiographs revealed   progressive degenerative changes with bone-on-bone   articulation to the  hip joint.  The patient had painful limited range of   motion significantly affecting their overall quality of life.  The patient was failing to    respond to conservative measures, and at this point was ready   to proceed with more definitive measures.  The patient has noted progressive   degenerative changes in his hip, progressive problems and dysfunction   with regarding the hip prior to surgery.  Consent was obtained for   benefit of pain relief.  Specific risk of infection, DVT, component   failure, dislocation, need for revision surgery, as well discussion of   the anterior versus posterior approach were reviewed.  Consent was   obtained for benefit of anterior pain relief through an anterior   approach.      PROCEDURE IN DETAIL:  The patient was brought to operative theater.   Once adequate anesthesia, preoperative  antibiotics, 2gm of Ancef, 1 gm of Tranexamic Acid, and 10 mg of Decadron administered.   The patient was positioned supine on the OSI Hanna table.  Once adequate   padding of boney process was carried out, we had predraped out the hip, and  used fluoroscopy to confirm orientation of the pelvis and position.      The right hip was then prepped and draped from proximal iliac crest to   mid thigh with shower curtain technique.      Time-out was performed identifying the patient, planned procedure, and   extremity.     An incision was then made 2 cm distal and lateral to the   anterior superior iliac spine extending over the orientation of the   tensor fascia lata muscle and sharp dissection was carried down to the   fascia of the muscle and protractor placed in the soft tissues.      The fascia was then incised.  The muscle belly was identified and swept   laterally and retractor placed along the superior neck.  Following   cauterization of the circumflex vessels and removing some pericapsular   fat, a second cobra retractor was placed on the inferior neck.  A third   retractor was placed on the anterior acetabulum after elevating the   anterior rectus.  A L-capsulotomy was along the line of the   superior neck to the trochanteric fossa, then extended proximally and   distally.  Tag sutures were placed and the retractors were then placed   intracapsular.  We then identified the trochanteric fossa and   orientation of my neck cut, confirmed this radiographically   and then made a neck osteotomy with the femur on traction.  The femoral   head was removed without difficulty or complication.  Traction was let   off and retractors were placed posterior and anterior around the   acetabulum.      The labrum and foveal tissue were debrided.  I began reaming with a 2mm   reamer and reamed up to 44mm reamer with good bony bed preparation and a 62mm   cup was chosen.  The final 13mm Pinnacle cup  was then impacted under fluoroscopy  to confirm the depth of penetration and orientation with respect to   abduction.  A screw was placed followed by the hole eliminator.  The final   36+4 neutral Altrex liner was impacted with good visualized rim fit.  The cup was positioned anatomically within the acetabular portion of the pelvis.      At this point, the femur was rolled at 80 degrees.  Further capsule was   released off the inferior aspect of the femoral neck.  I then   released the superior capsule proximally.  The hook was placed laterally   along the femur and elevated manually and held in position with the bed   hook.  The leg was then extended and adducted with the leg rolled to 100   degrees of external rotation.  Once the proximal femur was fully   exposed, I used a box osteotome to set orientation.  I then began   broaching with the starting chili pepper broach and passed this by hand and then broached up to 7.  With the 7 broach in place I chose a high offset neck and did several trial reductions.  Though I recognized there was some lengthening through this hip I felt it was clinically necessary to use the +5 head ball in order to recreate offset and restore muscle tension.  Note there significant circumferential acetabular osteophytes that were debrided decreasing  inherent soft tissue stability around hip.   Given these findings, I went ahead and dislocated the hip, repositioned all   retractors and positioned the right hip in the extended and abducted position.  The final 7 Hi Tri Lock stem was   chosen and it was impacted down to the level of neck cut.  Based on this   and the trial reduction, a 35+5 delta ceramic ball was chosen and   impacted onto a clean and dry trunnion, and the hip was reduced.  The   hip had been irrigated throughout the case again at this point.  I did   reapproximate the superior capsular leaflet to the anterior leaflet   using #1 Vicryl.  The fascia of  the   tensor fascia lata muscle was then reapproximated using #1 Vicryl and #0  V-lock sutures.  The   remaining wound was closed with 2-0 Vicryl and running 4-0 Monocryl.   The hip was cleaned, dried, and dressed sterilely using Dermabond and   Aquacel dressing.  He was then brought   to recovery room in stable condition tolerating the procedure well.    Nehemiah Massed, PA-C was present for the entirety of the case involved from   preoperative positioning, perioperative retractor management, general   facilitation of the case, as well as primary wound closure as assistant.            Pietro Cassis Alvan Dame, M.D.        08/15/2015 1:34 PM

## 2015-08-15 NOTE — Anesthesia Preprocedure Evaluation (Addendum)
Anesthesia Evaluation  Patient identified by MRN, date of birth, ID band Patient awake    Reviewed: Allergy & Precautions, NPO status , Patient's Chart, lab work & pertinent test results  Airway Mallampati: II  TM Distance: >3 FB Neck ROM: Full    Dental no notable dental hx.    Pulmonary pneumonia, resolved, former smoker,    Pulmonary exam normal breath sounds clear to auscultation       Cardiovascular Exercise Tolerance: Good hypertension, Pt. on medications Normal cardiovascular exam Rhythm:Regular Rate:Normal     Neuro/Psych negative neurological ROS  negative psych ROS   GI/Hepatic negative GI ROS, Neg liver ROS,   Endo/Other  negative endocrine ROS  Renal/GU Renal disease  negative genitourinary   Musculoskeletal negative musculoskeletal ROS (+)   Abdominal   Peds negative pediatric ROS (+)  Hematology negative hematology ROS (+)   Anesthesia Other Findings   Reproductive/Obstetrics negative OB ROS                            Anesthesia Physical Anesthesia Plan  ASA: II  Anesthesia Plan: Spinal   Post-op Pain Management:    Induction: Intravenous  Airway Management Planned: Natural Airway  Additional Equipment:   Intra-op Plan:   Post-operative Plan:   Informed Consent: I have reviewed the patients History and Physical, chart, labs and discussed the procedure including the risks, benefits and alternatives for the proposed anesthesia with the patient or authorized representative who has indicated his/her understanding and acceptance.   Dental advisory given  Plan Discussed with: CRNA  Anesthesia Plan Comments: (Discussed risks and benefits of and differences between spinal and general. Discussed risks of spinal including headache, backache, failure, bleeding and hematoma, infection, and nerve damage. Patient consents to spinal. Questions answered. Coagulation studies  and platelet count acceptable.)       Anesthesia Quick Evaluation

## 2015-08-15 NOTE — Discharge Instructions (Signed)

## 2015-08-16 LAB — CBC
HCT: 40.7 % (ref 39.0–52.0)
HEMOGLOBIN: 14.1 g/dL (ref 13.0–17.0)
MCH: 28.9 pg (ref 26.0–34.0)
MCHC: 34.6 g/dL (ref 30.0–36.0)
MCV: 83.4 fL (ref 78.0–100.0)
Platelets: 165 10*3/uL (ref 150–400)
RBC: 4.88 MIL/uL (ref 4.22–5.81)
RDW: 13.3 % (ref 11.5–15.5)
WBC: 15.2 10*3/uL — ABNORMAL HIGH (ref 4.0–10.5)

## 2015-08-16 LAB — BASIC METABOLIC PANEL
Anion gap: 8 (ref 5–15)
BUN: 14 mg/dL (ref 6–20)
CALCIUM: 8.3 mg/dL — AB (ref 8.9–10.3)
CHLORIDE: 98 mmol/L — AB (ref 101–111)
CO2: 24 mmol/L (ref 22–32)
CREATININE: 0.69 mg/dL (ref 0.61–1.24)
Glucose, Bld: 140 mg/dL — ABNORMAL HIGH (ref 65–99)
Potassium: 3.8 mmol/L (ref 3.5–5.1)
SODIUM: 130 mmol/L — AB (ref 135–145)

## 2015-08-16 MED ORDER — FERROUS SULFATE 325 (65 FE) MG PO TABS: 325.0000 mg | ORAL_TABLET | Freq: Three times a day (TID) | ORAL | Status: AC

## 2015-08-16 MED ORDER — POLYETHYLENE GLYCOL 3350 17 G PO PACK: 17.0000 g | PACK | Freq: Two times a day (BID) | ORAL | Status: AC

## 2015-08-16 MED ORDER — DOCUSATE SODIUM 100 MG PO CAPS: 100.0000 mg | ORAL_CAPSULE | Freq: Two times a day (BID) | ORAL | Status: AC

## 2015-08-16 MED ORDER — HYDROCODONE-ACETAMINOPHEN 7.5-325 MG PO TABS: 1.0000 | ORAL_TABLET | ORAL | Status: AC | PRN

## 2015-08-16 MED ORDER — ASPIRIN 325 MG PO TBEC: 325.0000 mg | DELAYED_RELEASE_TABLET | Freq: Two times a day (BID) | ORAL | Status: AC

## 2015-08-16 MED ORDER — TIZANIDINE HCL 4 MG PO TABS
4.0000 mg | ORAL_TABLET | Freq: Four times a day (QID) | ORAL | Status: AC | PRN
Start: 2015-08-16 — End: ?

## 2015-08-16 NOTE — Evaluation (Signed)
Occupational Therapy Evaluation Patient Details Name: Troy Mcdowell MRN: [id number] DOB: 1933-10-05 Today's Date: 08/16/2015    History of Present Illness 81 y.o. M, admitted for R THA AA 08/15/2015.   Clinical Impression   Patient presenting with decreased ADL and functional mobility independence. Patient independent PTA. Patient currently functioning at an overall min to mod assist level. Patient will benefit from acute OT to increase overall independence in the areas of ADLs, functional mobility, and overall safety in order to safely discharge home with wife.     Follow Up Recommendations  No OT follow up;Supervision/Assistance - 24 hour    Equipment Recommendations  None recommended by OT    Recommendations for Other Services  None at this time   Precautions / Restrictions Precautions Precautions: Fall Restrictions Weight Bearing Restrictions: Yes RLE Weight Bearing: Weight bearing as tolerated    Mobility Bed Mobility General bed mobility comments: Pt found seated in recliner upon OT entering/exiting room. See PT eval for more information   Transfers Overall transfer level: Needs assistance Equipment used: Rolling walker (2 wheeled) Transfers: Sit to/from Stand Sit to Stand: Min guard General transfer comment: Min guard to assist with steadying patient. Cues for safety and hand placement.     Balance Overall balance assessment: Needs assistance Sitting-balance support: No upper extremity supported;Feet supported Sitting balance-Leahy Scale: Good     Standing balance support: Bilateral upper extremity supported;During functional activity Standing balance-Leahy Scale: Fair    ADL Overall ADL's : Needs assistance/impaired Eating/Feeding: Set up;Sitting   Grooming: Set up;Sitting   Upper Body Bathing: Set up;Sitting   Lower Body Bathing: Moderate assistance;Sit to/from stand   Upper Body Dressing : Set up;Sitting   Lower Body Dressing: Moderate  assistance;Sit to/from stand   Toilet Transfer: Min guard;RW;Grab bars;Ambulation;Comfort height toilet Functional mobility during ADLs: Min guard;Cueing for safety;Rolling walker General ADL Comments: Increased cueing for safety during ADL and functional mobility. Pt moves quickly and can be impulsive.     Pertinent Vitals/Pain Pain Assessment: No/denies pain     Hand Dominance Right   Extremity/Trunk Assessment Upper Extremity Assessment Upper Extremity Assessment: Overall WFL for tasks assessed   Lower Extremity Assessment Lower Extremity Assessment: Defer to PT evaluation       Communication Communication Communication: No difficulties   Cognition Arousal/Alertness: Awake/alert Behavior During Therapy: WFL for tasks assessed/performed Overall Cognitive Status: Within Functional Limits for tasks assessed             Home Living Family/patient expects to be discharged to:: Private residence Living Arrangements: Spouse/significant other Available Help at Discharge: Family Type of Home: House Home Access: Stairs to enter [employer] of Steps: 2 - in garage    Home Layout: One level     Bathroom Shower/Tub: Tub/shower unit;Curtain   Bathroom Toilet: Handicapped height     Home Equipment: Cane - single point;Shower seat;Hand held shower head   Prior Functioning/Environment Level of Independence: Independent     OT Diagnosis: Generalized weakness   OT Problem List: Decreased strength;Decreased range of motion;Decreased activity tolerance;Impaired balance (sitting and/or standing);Decreased safety awareness;Decreased knowledge of use of DME or AE   OT Treatment/Interventions: Self-care/ADL training;Therapeutic exercise;Energy conservation;DME and/or AE instruction;Therapeutic activities;Patient/family education;Balance training    OT Goals(Current goals can be found in the care plan section) Acute Rehab OT Goals Patient Stated Goal: go home today OT  Goal Formulation: With patient/family Time For Goal Achievement: 08/30/15 Potential to Achieve Goals: Good ADL Goals Pt Will Perform Grooming: with modified independence;standing Pt Will  Perform Lower Body Bathing: with modified independence;sit to/from stand;with adaptive equipment Pt Will Perform Lower Body Dressing: with modified independence;sit to/from stand;with adaptive equipment Pt Will Transfer to Toilet: with modified independence;ambulating Pt Will Perform Tub/Shower Transfer: Tub transfer;ambulating;shower seat;rolling walker;with modified independence Additional ADL Goal #1: Pt will be mod I with functional mobility using RW  OT Frequency: Min 2X/week   Barriers to D/C: none known at this time   End of Session Equipment Utilized During Treatment: Gait belt;Rolling walker  Activity Tolerance: Patient tolerated treatment well Patient left: in chair;with call bell/phone within reach;with family/visitor present   Time: 1001-1024 OT Time Calculation (min): 23 min Charges:  OT General Charges $OT Visit: 1 Procedure OT Evaluation $Initial OT Evaluation Tier I: 1 Procedure OT Treatments $Self Care/Home Management : 8-22 mins  Chrys Racer , MS, OTR/L, CLT Pager: [phone]  08/16/2015, 11:14 AM

## 2015-08-16 NOTE — Progress Notes (Signed)
Physical Therapy Treatment Patient Details Name: Troy Mcdowell MRN: [id number] DOB: [birth year] Today's Date: 08/16/2015    History of Present Illness 81 y.o. M, admitted for R THA AA 08/15/2015.    PT Comments    Progressing well. Reminders to maintain RW with him.  Follow Up Recommendations  No PT follow up;Supervision/Assistance - 24 hour     Equipment Recommendations  Rolling walker with 5" wheels    Recommendations for Other Services       Precautions / Restrictions Precautions Precautions: Fall    Mobility  Bed Mobility               General bed mobility comments: in recliner  Transfers   Equipment used: Rolling walker (2 wheeled) Transfers: Sit to/from Stand Sit to Stand: Supervision         General transfer comment: cues for safety, stood without RW in front  Ambulation/Gait Ambulation/Gait assistance: Supervision Ambulation Distance (Feet): 300 Feet Assistive device: Rolling walker (2 wheeled) Gait Pattern/deviations: Step-through pattern     General Gait Details: improved balance   Stairs Stairs: Yes Stairs assistance: Min guard Stair Management: Step to pattern;Forwards Number of Stairs: 2 General stair comments: held onto simulated door fraME AS PATIENT HAS GOOD WEIGHT BEARING ON THE RIGHT  Wheelchair Mobility    Modified Rankin (Stroke Patients Only)       Balance                                    Cognition Arousal/Alertness: Awake/alert                          Exercises      General Comments        Pertinent Vitals/Pain Pain Assessment: No/denies pain Pain Score: 0-No pain Pain Intervention(s): Ice applied    Home Living                      Prior Function            PT Goals (current goals can now be found in the care plan section) Progress towards PT goals: Progressing toward goals    Frequency  7X/week    PT Plan Current plan remains appropriate     Co-evaluation             End of Session   Activity Tolerance: Patient tolerated treatment well Patient left: in chair;with call bell/phone within reach;with chair alarm set     Time: 1350-1418 PT Time Calculation (min) (ACUTE ONLY): 28 min  Charges:  $Gait Training: 23-37 mins                    G Codes:      Claretha Cooper 08/16/2015, 5:18 PM

## 2015-08-16 NOTE — Progress Notes (Signed)
     Subjective: 1 Day Post-Op Procedure(s) (LRB): RIGHT TOTAL HIP ARTHROPLASTY ANTERIOR APPROACH (Right)   Patient reports pain as mild, pain controlled. No events throughout the night.  Ready to work with PT.  Ready to be discharged home.  Objective:   VITALS:   Filed Vitals:   08/16/15 0235 08/16/15 0558  BP: 140/70 148/78  Pulse: 75 74  Temp: 97.6 F (36.4 C) 97.4 F (36.3 C)  Resp: 16 18    Dorsiflexion/Plantar flexion intact Incision: dressing C/D/I No cellulitis present Compartment soft  LABS  Recent Labs  08/16/15 0532  HGB 14.1  HCT 40.7  WBC 15.2*  PLT 165     Recent Labs  08/16/15 0532  NA 130*  K 3.8  BUN 14  CREATININE 0.69  GLUCOSE 140*     Assessment/Plan: 1 Day Post-Op Procedure(s) (LRB): RIGHT TOTAL HIP ARTHROPLASTY ANTERIOR APPROACH (Right) Foley cath d/c'ed Advance diet Up with therapy D/C IV fluids Discharge home with home health  Follow up in 2 weeks at [facility]. Follow up with OLIN,Ayeshia Coppin D in 2 weeks.  Contact information:  [facility] [address] [id number]    Obese (BMI 30-39.9) Estimated body mass index is 32.03 kg/(m^2) as calculated from the following:   Height as of this encounter: 5\' 9"  (1.753 m).   Weight as of this encounter: 98.431 kg (217 lb). Patient also counseled that weight may inhibit the healing process Patient counseled that losing weight will help with future health issues        [place] Pugh. Carren Blakley   PAC  08/16/2015, 9:24 AM

## 2015-08-16 NOTE — Care Management Note (Signed)
Case Management Note  Patient Details  Name: Rosbel Amador MRN: [id number] Date of Birth: [birth year]  Subjective/Objective:     81 y.o. M, admitted for  R THA AA 08/15/2015.   Pt will have assist of spouse in private residence.              Action/Plan: Anticipate discharge home today. [facility] will provide RW per Merry Proud. Decline need for 3n1 as they have elevated commode and shower seat in their home.  No further CM needs but will be available should additional discharge needs arise.   Expected Discharge Date:                  Expected Discharge Plan:  [place]  In-House Referral:     Discharge planning Services  CM Consult  Post Acute Care Choice:  Durable Medical Equipment Choice offered to:  Patient, Spouse  DME Arranged:  Walker rolling DME Agency:  [place]:    [place]:     Status of Service:  Completed, signed off  Medicare Important Message Given:    Date Medicare IM Given:    Medicare IM give by:    Date Additional Medicare IM Given:    Additional Medicare Important Message give by:     If discussed at [place] of Stay Meetings, dates discussed:    Additional Comments:  Delrae Sawyers, RN 08/16/2015, 10:19 AM

## 2015-08-16 NOTE — Evaluation (Signed)
Physical Therapy Evaluation Patient Details Name: Troy Mcdowell MRN: [id number] DOB: [birth year] Today's Date: 08/16/2015   History of Present Illness  81 y.o. M, admitted for R THA AA 08/15/2015.  Clinical Impression  Patient mobilizing well, mild balance loss  During ambulation. Cautioned wife to ambulate with the patient at DC. Pt admitted with above diagnosis. Pt currently with functional limitations due to the deficits listed below (see PT Problem List). Pt will benefit from skilled PT to increase their independence and safety with mobility to allow discharge to home.      Follow Up Recommendations No PT follow up;Supervision/Assistance - 24 hour (per chart)    Equipment Recommendations  Rolling walker with 5" wheels    Recommendations for Other Services       Precautions / Restrictions Precautions Precautions: Fall Restrictions Weight Bearing Restrictions: Yes RLE Weight Bearing: Weight bearing as tolerated      Mobility  Bed Mobility Overal bed mobility: Needs Assistance Bed Mobility: Supine to Sit     Supine to sit: Min assist     General bed mobility comments: extra time, cues for technique..some difficulty getting trunk flexed, min assist for the  R leg.  Transfers Overall transfer level: Needs assistance Equipment used: Rolling walker (2 wheeled) Transfers: Sit to/from Stand Sit to Stand: Min guard         General transfer comment: Min guard to assist with steadying patient. Cues for safety and hand placement.   Ambulation/Gait Ambulation/Gait assistance: Min assist Ambulation Distance (Feet): 130 Feet Assistive device: Rolling walker (2 wheeled) Gait Pattern/deviations: Step-to pattern;Step-through pattern;Trunk flexed Gait velocity: slow   General Gait Details: balance loss posterior x 2, steady assist to guard. cues for sequence  Stairs            Wheelchair Mobility    Modified Rankin (Stroke Patients Only)       Balance  Overall balance assessment: Needs assistance Sitting-balance support: No upper extremity supported;Feet supported Sitting balance-Leahy Scale: Good     Standing balance support: Bilateral upper extremity supported;During functional activity Standing balance-Leahy Scale: Fair Standing balance comment: posterior                             Pertinent Vitals/Pain Pain Assessment: 0-10 Pain Score: 0-No pain Pain Descriptors / Indicators: Tightness Pain Intervention(s): Ice applied    Home Living Family/patient expects to be discharged to:: Private residence Living Arrangements: Spouse/significant other Available Help at Discharge: Family Type of Home: House Home Access: Stairs to enter   [employer] of Steps: 2 - in garage  Home Layout: One level Home Equipment: Cane - single point;Shower seat;Hand held shower head      Prior Function Level of Independence: Independent               Hand Dominance   Dominant Hand: Right    Extremity/Trunk Assessment   Upper Extremity Assessment: Overall WFL for tasks assessed           Lower Extremity Assessment: RLE deficits/detail RLE Deficits / Details: hip flexion to 90 in sitting,     Cervical / Trunk Assessment: Other exceptions  Communication   Communication: No difficulties  Cognition Arousal/Alertness: Awake/alert Behavior During Therapy: WFL for tasks assessed/performed Overall Cognitive Status: Within Functional Limits for tasks assessed                      General Comments  Exercises Total Joint Exercises Ankle Circles/Pumps: AROM;Both;10 reps;Supine Quad Sets: AROM;Both;10 reps;Supine Short Arc Quad: AROM;Right;10 reps;Supine Heel Slides: AAROM;Right;10 reps;Supine Hip ABduction/ADduction: AAROM;Right;10 reps;Supine      Assessment/Plan    PT Assessment Patient needs continued PT services  PT Diagnosis Difficulty walking   PT Problem List Decreased  strength;Decreased range of motion;Decreased activity tolerance;Decreased balance;Decreased mobility;Decreased knowledge of precautions;Decreased safety awareness;Decreased knowledge of use of DME  PT Treatment Interventions DME instruction;Gait training;Stair training;Functional mobility training;Therapeutic activities;Therapeutic exercise;Patient/family education;Balance training   PT Goals (Current goals can be found in the Care Plan section) Acute Rehab PT Goals Patient Stated Goal: go home today PT Goal Formulation: With patient/family Time For Goal Achievement: 08/18/15 Potential to Achieve Goals: Good    Frequency 7X/week   Barriers to discharge        Co-evaluation               End of Session   Activity Tolerance: Patient tolerated treatment well Patient left: in chair;with call bell/phone within reach;with chair alarm set Nurse Communication: Mobility status         Time: 0920-0952 PT Time Calculation (min) (ACUTE ONLY): 32 min   Charges:   PT Evaluation $Initial PT Evaluation Tier I: 1 Procedure PT Treatments $Gait Training: 8-22 mins   PT G Codes:        Claretha Cooper 08/16/2015, 11:29 AM

## 2015-08-16 NOTE — Progress Notes (Signed)
Pt to d/c home. DME (RW delivered to room before d/c) AVS reviewed and "My Chart" discussed with pt. Pt capable of verbalizing medications, signs and symptoms of infection, and follow-up appointments. Remains hemodynamically stable. No signs and symptoms of distress. Educated pt to return to ER in the case of SOB, dizziness, or chest pain.

## 2015-08-19 DIAGNOSIS — E669 Obesity, unspecified: Secondary | ICD-10-CM | POA: Diagnosis present

## 2015-08-24 NOTE — Discharge Summary (Signed)
Physician Discharge Summary  Patient ID: Troy Mcdowell MRN: [id number] DOB/AGE: August 01, 1934 81 y.o.  Admit date: 08/15/2015 Discharge date: 08/16/2015   Procedures:  Procedure(s) (LRB): RIGHT TOTAL HIP ARTHROPLASTY ANTERIOR APPROACH (Right)  Attending Physician:  Dr. Paralee Cancel   Admission Diagnoses:   Right hip primary OA / pain  Discharge Diagnoses:  Principal Problem:   S/P right THA, AA Active Problems:   Obese  Past Medical History  Diagnosis Date  . Prostate cancer ([place]) 09/11/11    prosate ca,gleason $=4=8,PSa=4.75,volume=41cc  . Chronic kidney disease     nephrolithiasis  . Hypertension   . BPH (benign prostatic hyperplasia)   . Cataract     forming  no surgery as yet,obs  . Pneumonia     hx of    HPI:    Troy Mcdowell, 81 y.o. male, has a history of pain and functional disability in the right hip(s) due to arthritis and patient has failed non-surgical conservative treatments for greater than 12 weeks to include NSAID's and/or analgesics and activity modification. Onset of symptoms was gradual starting 2.5+ years ago with gradually worsening course since that time.The patient noted no past surgery on the right hip(s). Patient currently rates pain in the right hip at 1 out of 10 with activity, more complaints significant decreased ROM. Patient has worsening of pain with activity and weight bearing, trendelenberg gait, pain that interfers with activities of daily living and pain with passive range of motion. Patient has evidence of periarticular osteophytes and joint space narrowing by imaging studies. This condition presents safety issues increasing the risk of falls. There is no current active infection. Risks, benefits and expectations were discussed with the patient. Risks including but not limited to the risk of anesthesia, blood clots, nerve damage, blood vessel damage, failure of the prosthesis, infection and up to and including death. Patient understand the  risks, benefits and expectations and wishes to proceed with surgery.   PCP: Charlotte Sanes, MD   Discharged Condition: good  Hospital Course:  Patient underwent the above stated procedure on 08/15/2015. Patient tolerated the procedure well and brought to the recovery room in good condition and subsequently to the floor.  POD #1 BP: 148/78 ; Pulse: 74 ; Temp: 97.4 F (36.3 C) ; Resp: 18 Patient reports pain as mild, pain controlled. No events throughout the night. Ready to work with PT. Ready to be discharged home. Dorsiflexion/plantar flexion intact, incision: dressing C/D/I, no cellulitis present and compartment soft.   LABS  Basename    HGB     14.1  HCT     40.7    Discharge Exam: General appearance: alert, cooperative and no distress Extremities: Homans sign is negative, no sign of DVT, no edema, redness or tenderness in the calves or thighs and no ulcers, gangrene or trophic changes  Disposition: Home with follow up in 2 weeks   Follow-up Information    Follow up with Mauri Pole, MD. Schedule an appointment as soon as possible for a visit in 2 weeks.   Specialty:  Orthopedic Surgery   Contact information:   [address] [place] [address] [phone]       Follow up with [facility].   Why:  RW will be delivered prior to discharge   Contact information:   [address] [place] [address] [phone]       Discharge Instructions    Call MD / Call 911    Complete by:  As directed   If you experience chest pain or shortness of breath, CALL 911 and be transported to the hospital emergency room.  If you develope a fever above 101 F, pus (white drainage) or increased drainage or redness at the wound, or calf pain, call your surgeon's office.     Change dressing    Complete by:  As directed   Maintain surgical dressing until follow up in the clinic. If the edges start to pull up, may reinforce with tape. If the  dressing is no longer working, may remove and cover with gauze and tape, but must keep the area dry and clean.  Call with any questions or concerns.     Constipation Prevention    Complete by:  As directed   Drink plenty of fluids.  Prune juice may be helpful.  You may use a stool softener, such as Colace (over the counter) 100 mg twice a day.  Use MiraLax (over the counter) for constipation as needed.     Diet - low sodium heart healthy    Complete by:  As directed      Discharge instructions    Complete by:  As directed   Maintain surgical dressing until follow up in the clinic. If the edges start to pull up, may reinforce with tape. If the dressing is no longer working, may remove and cover with gauze and tape, but must keep the area dry and clean.  Follow up in 2 weeks at [facility]. Call with any questions or concerns.     Increase activity slowly as tolerated    Complete by:  As directed   Weight bearing as tolerated with assist device (walker, cane, etc) as directed, use it as long as suggested by your surgeon or therapist, typically at least 4-6 weeks.     TED hose    Complete by:  As directed   Use stockings (TED hose) for 2 weeks on both leg(s).  You may remove them at night for sleeping.             Medication List    STOP taking these medications        aspirin 81 MG tablet  Replaced by:  aspirin 325 MG EC tablet     ibuprofen 200 MG tablet  Commonly known as:  ADVIL,MOTRIN      TAKE these medications        amLODipine 10 MG tablet  Commonly known as:  NORVASC  Take 10 mg by mouth daily.     aspirin 325 MG EC tablet  Take 1 tablet (325 mg total) by mouth 2 (two) times daily.     docusate sodium 100 MG capsule  Commonly known as:  COLACE  Take 1 capsule (100 mg total) by mouth 2 (two) times daily.     ferrous sulfate 325 (65 FE) MG tablet  Take 1 tablet (325 mg total) by mouth 3 (three) times daily after meals.     HYDROcodone-acetaminophen  7.5-325 MG tablet  Commonly known as:  NORCO  Take 1-2 tablets by mouth every 4 (four) hours as needed for moderate pain.     polyethylene glycol packet  Commonly known as:  MIRALAX / GLYCOLAX  Take 17 g by mouth 2 (two) times daily.     tiZANidine 4 MG tablet  Commonly known as:  ZANAFLEX  Take 1 tablet (4 mg total) by mouth every 6 (six) hours as needed for muscle spasms.  Signed: [place] Pugh. Briah Nary   PA-C  08/24/2015, 10:16 AM

## 2017-01-09 IMAGING — DX DG HIP (WITH OR WITHOUT PELVIS) 1V PORT*R*
2 series · 2 of 2 positions shown · non-contrast
Comparison: None.

CLINICAL DATA: Status post right hip replacement today. Initial
encounter.

EXAM:
DG HIP (WITH OR WITHOUT PELVIS) 1V PORT RIGHT

[pelvis ap]
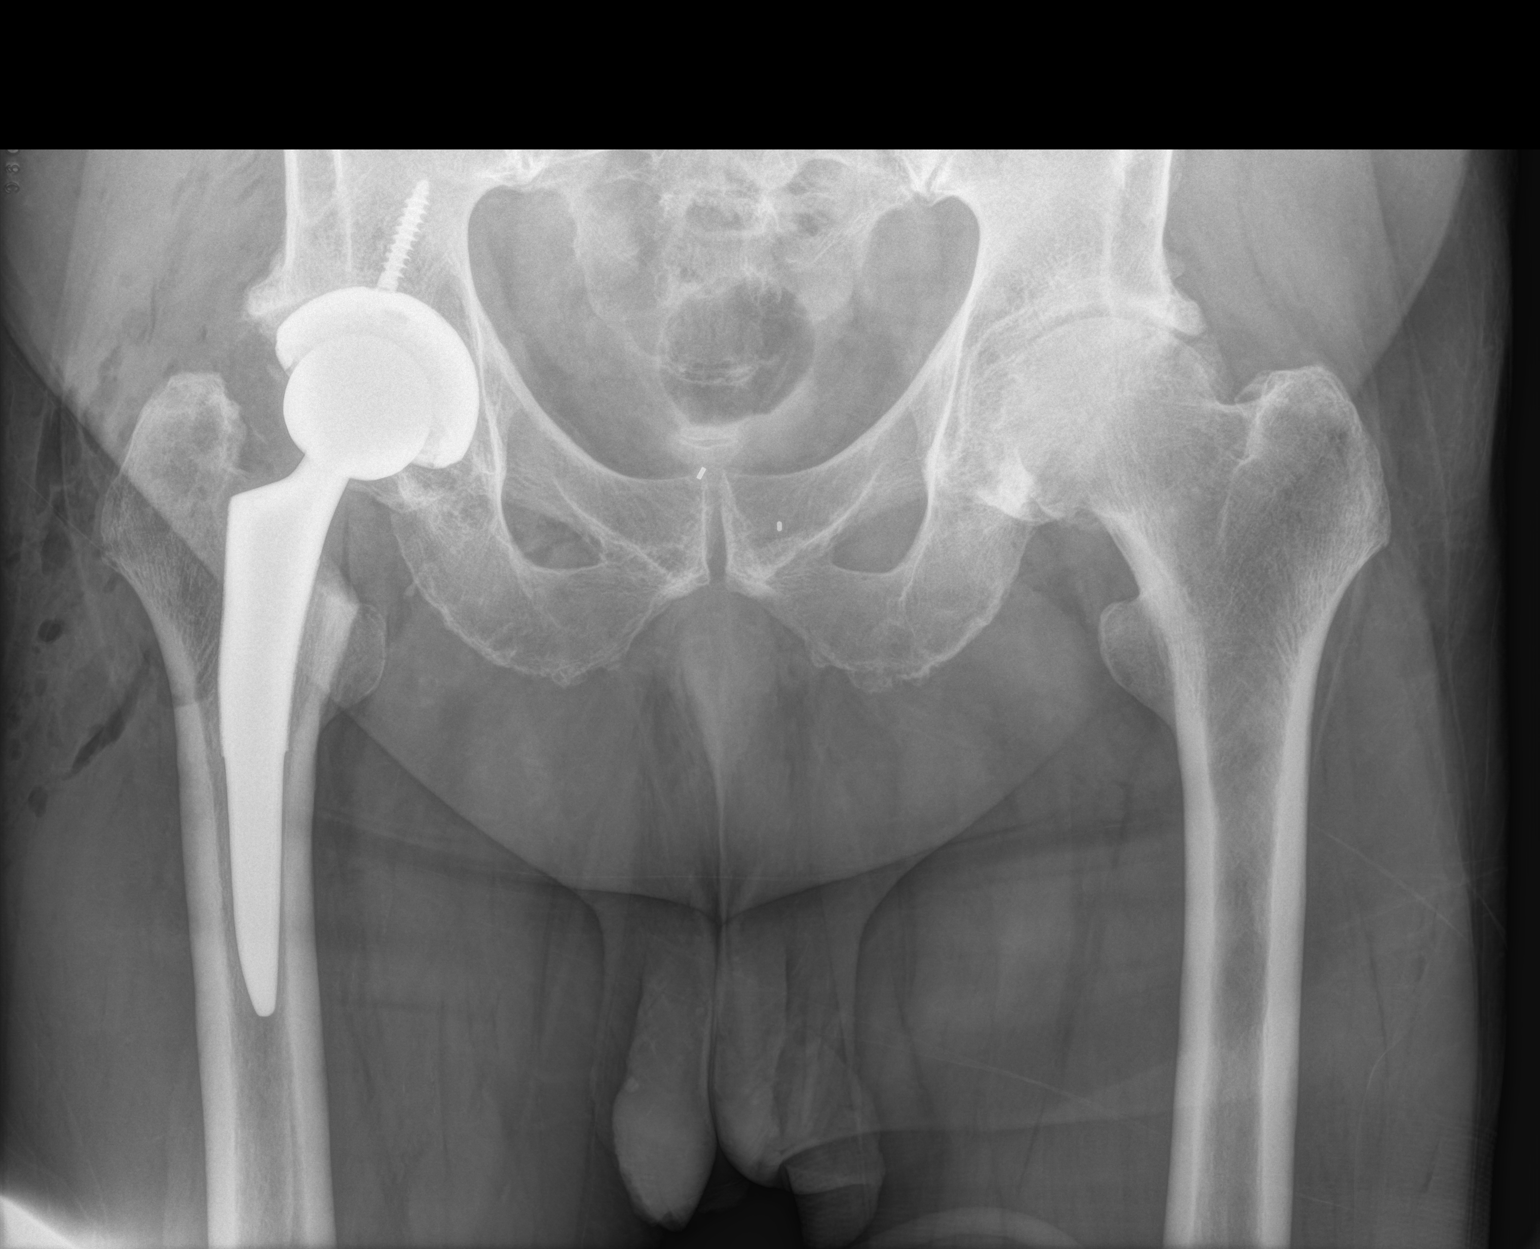

[hip x-table]
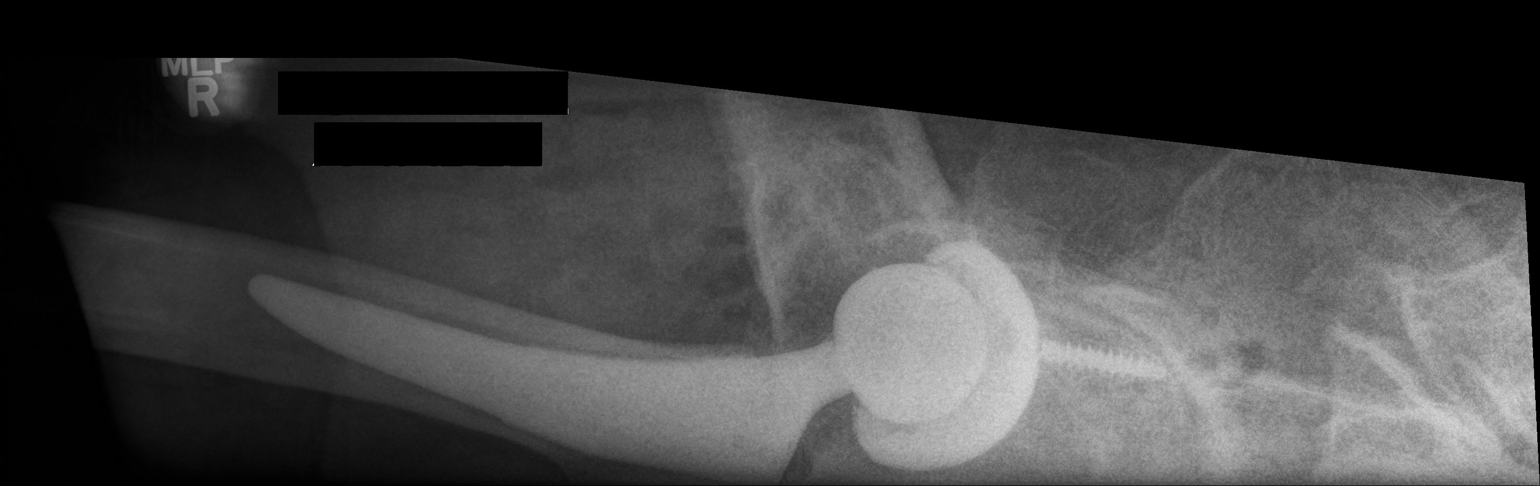

[2 of 2 positions shown; findings below may reference images not displayed]

FINDINGS: Right total hip arthroplasty is in place. The device is located and
no fracture is identified. Gas in the soft tissues from surgery
noted. Left hip osteoarthritis is seen.
IMPRESSION: Status post right hip replacement without evidence of complication.
No acute finding.

## 2023-02-03 DIAGNOSIS — I4510 Unspecified right bundle-branch block: Secondary | ICD-10-CM

## 2023-02-03 DIAGNOSIS — I444 Left anterior fascicular block: Secondary | ICD-10-CM
